# Patient Record
Sex: Male | Born: 1975 | Hispanic: No | Marital: Married | Smoking: Current every day smoker
Health system: Southern US, Community
[De-identification: ages and names within clinical notes are randomized; demographics above are authoritative.]

## PROBLEM LIST (undated history)

## (undated) DIAGNOSIS — E785 Hyperlipidemia, unspecified: Secondary | ICD-10-CM

## (undated) DIAGNOSIS — K4090 Unilateral inguinal hernia, without obstruction or gangrene, not specified as recurrent: Secondary | ICD-10-CM

## (undated) DIAGNOSIS — R519 Headache, unspecified: Secondary | ICD-10-CM

## (undated) DIAGNOSIS — R51 Headache: Secondary | ICD-10-CM

## (undated) DIAGNOSIS — T7840XA Allergy, unspecified, initial encounter: Secondary | ICD-10-CM

## (undated) HISTORY — DX: Allergy, unspecified, initial encounter: T78.40XA

## (undated) HISTORY — DX: Hyperlipidemia, unspecified: E78.5

## (undated) HISTORY — DX: Unilateral inguinal hernia, without obstruction or gangrene, not specified as recurrent: K40.90

---

## 2000-08-23 HISTORY — PX: ANKLE SURGERY: SHX546

## 2012-01-11 ENCOUNTER — Ambulatory Visit: Payer: Self-pay | Admitting: Internal Medicine

## 2012-01-18 ENCOUNTER — Ambulatory Visit: Payer: Self-pay | Admitting: Family Medicine

## 2012-01-25 ENCOUNTER — Ambulatory Visit: Payer: Self-pay | Admitting: Internal Medicine

## 2012-10-29 ENCOUNTER — Emergency Department: Payer: Self-pay | Admitting: Emergency Medicine

## 2013-07-11 ENCOUNTER — Emergency Department: Payer: Self-pay | Admitting: Emergency Medicine

## 2014-09-11 ENCOUNTER — Ambulatory Visit: Payer: Self-pay | Admitting: Emergency Medicine

## 2014-11-03 IMAGING — CR DG LUMBAR SPINE 2-3V
1 series · 3 of 3 positions shown · non-contrast
Comparison: None.

CLINICAL DATA: Low back pain

EXAM:
LUMBAR SPINE - 2-3 VIEW

[Series 1: t lumbar spine ap · 0.14mm/px · 3 of 3 slices shown]
[im 1/3]
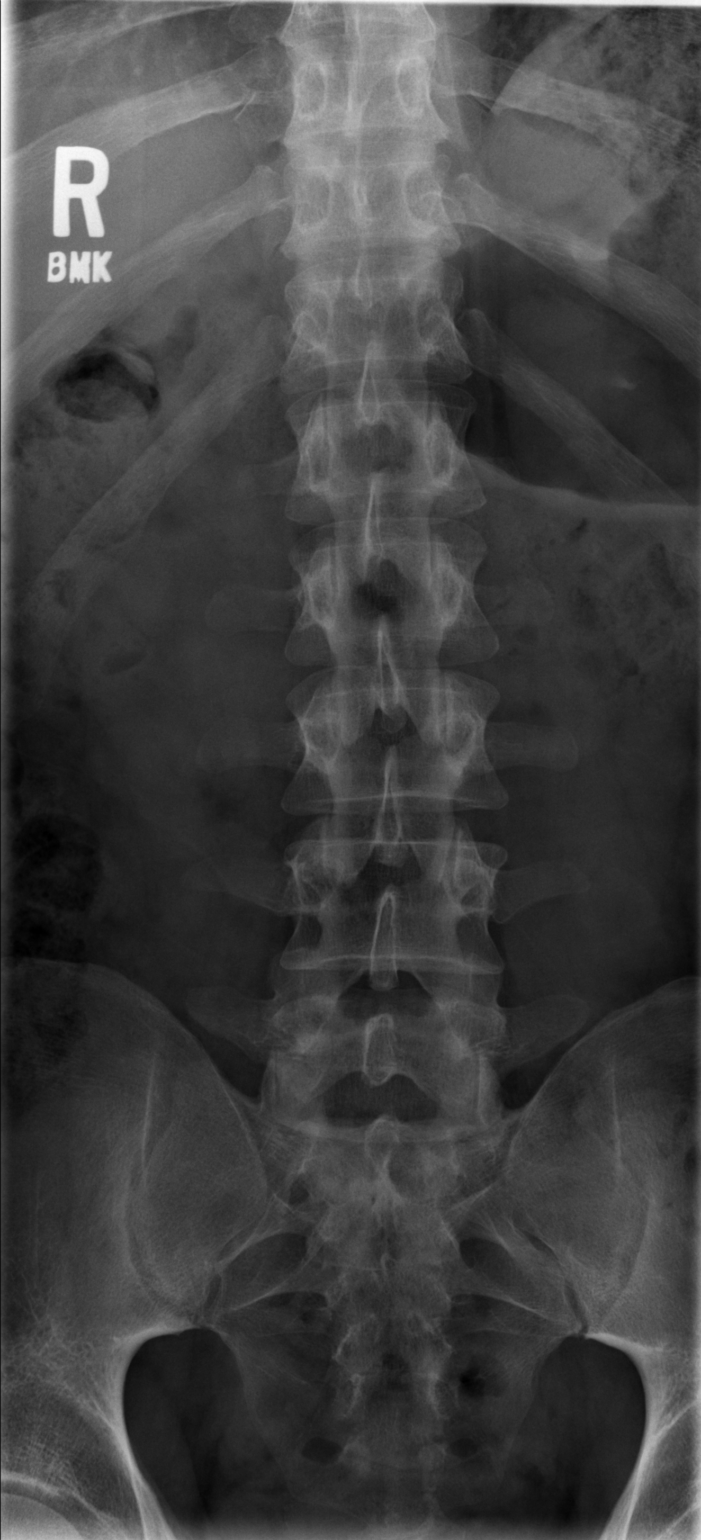
[im 2/3]
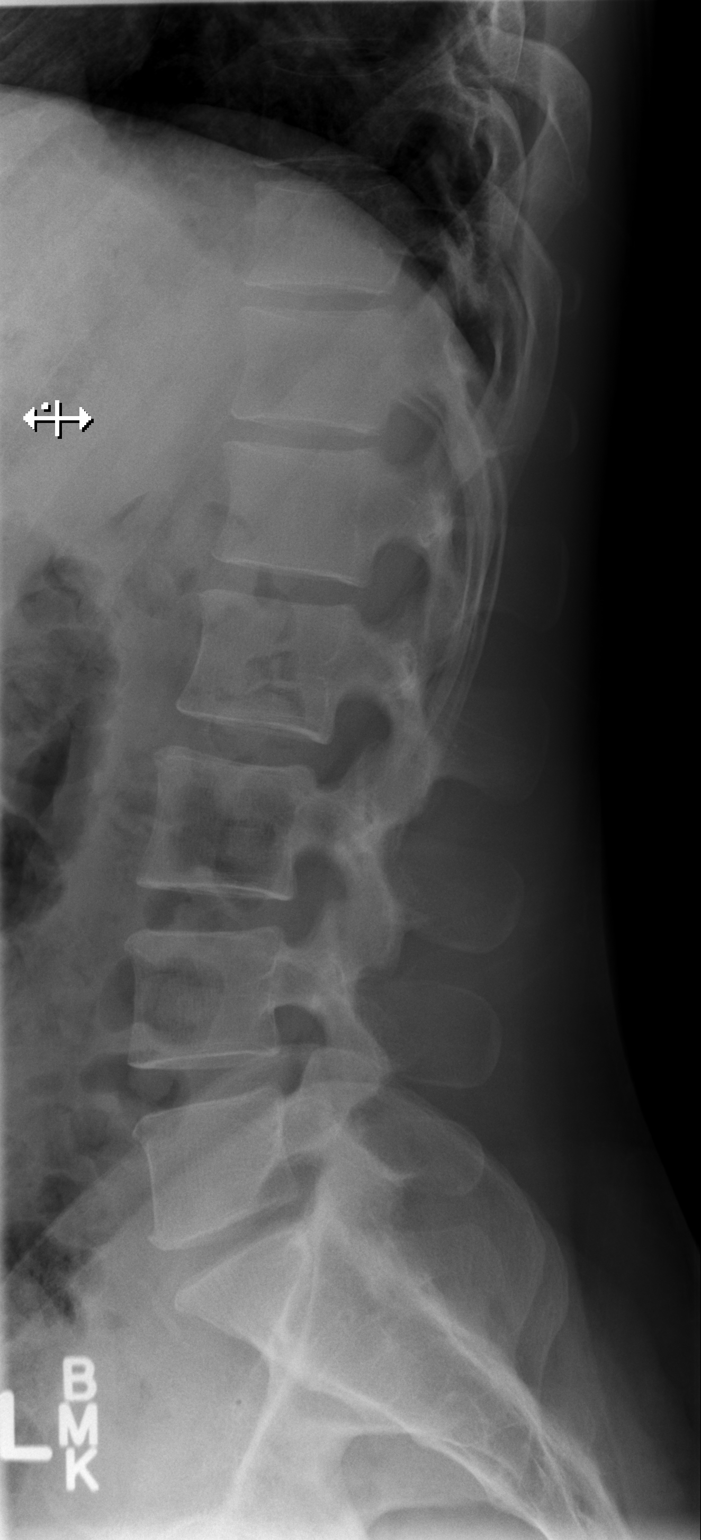
[im 3/3]
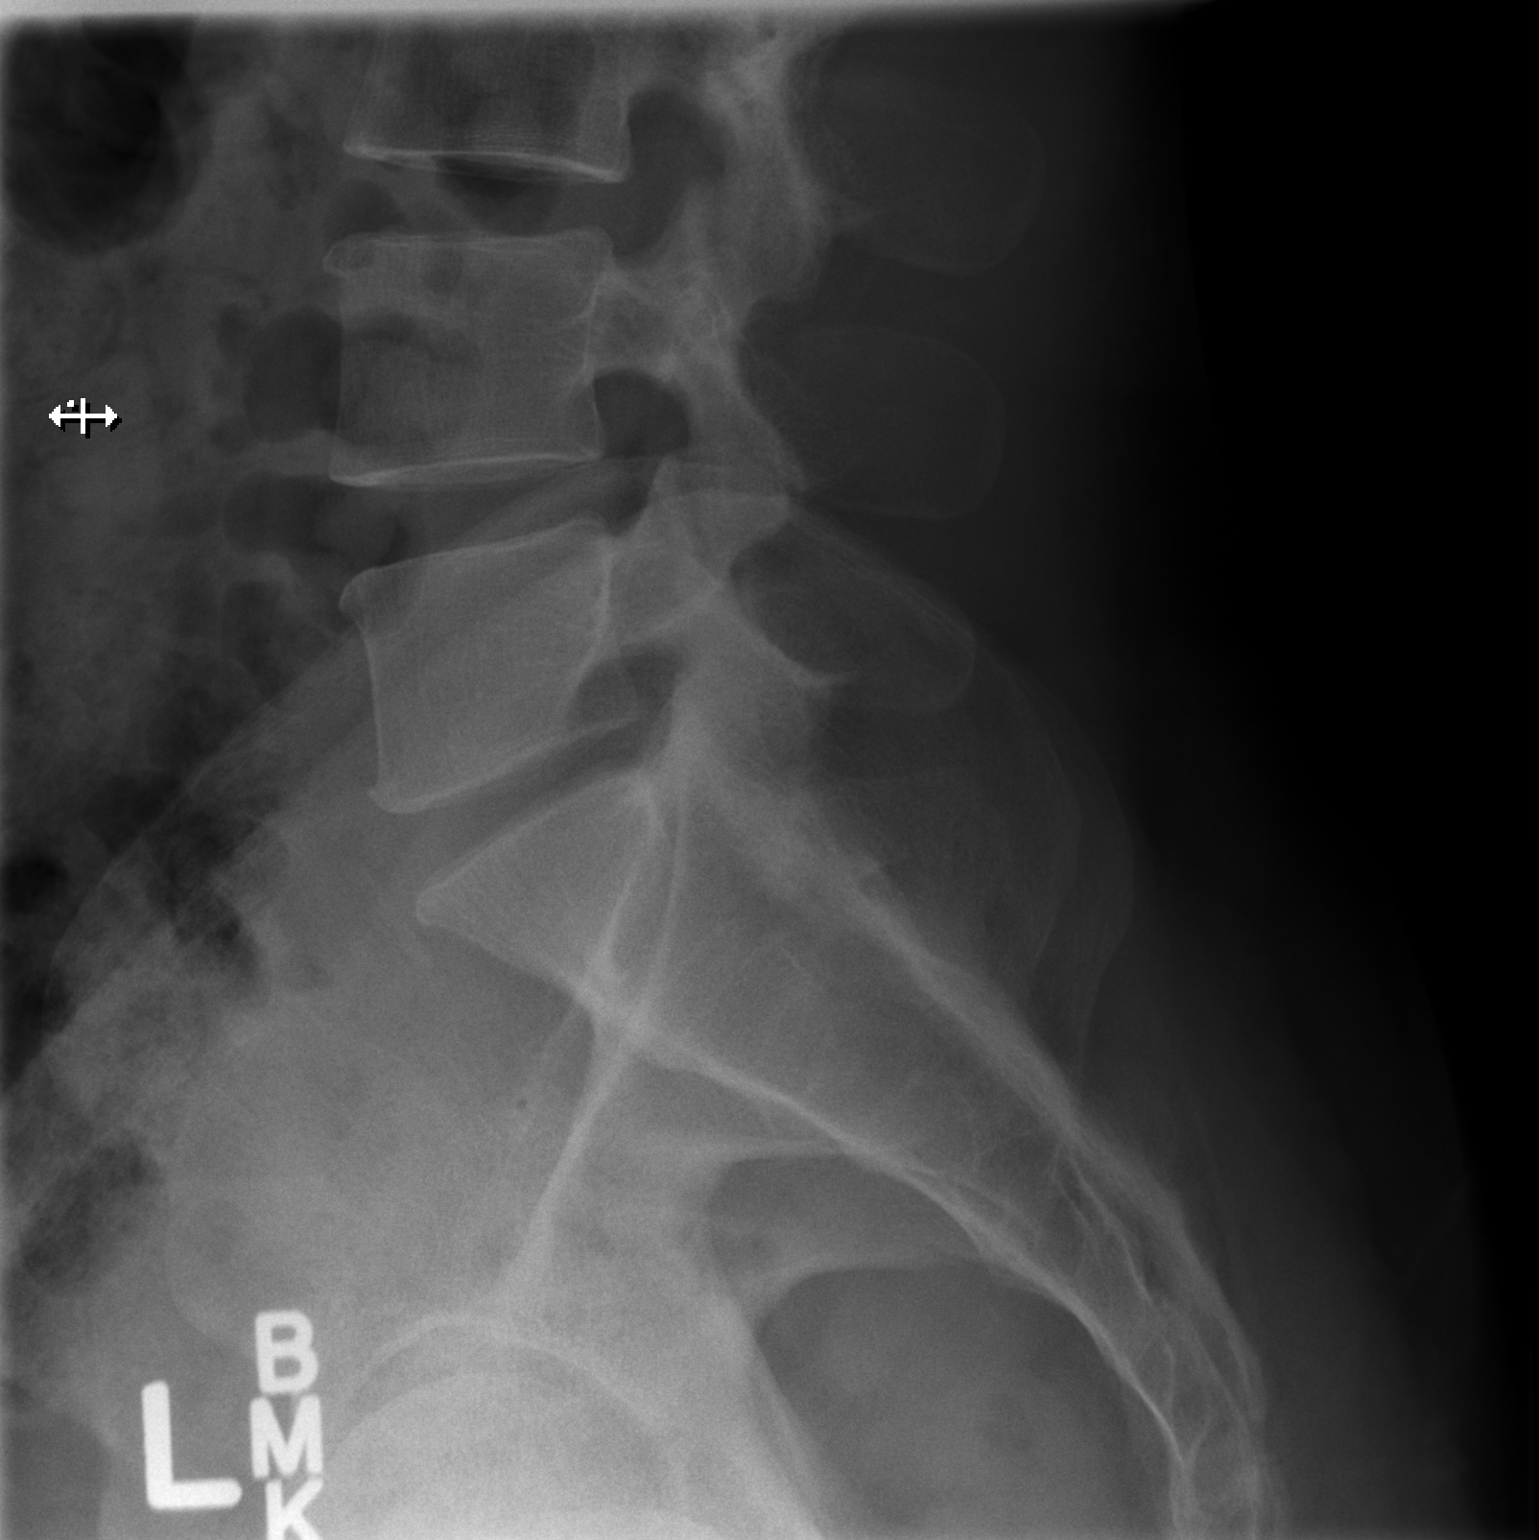

[3 of 3 positions shown; findings below may reference images not displayed]

FINDINGS: Frontal, lateral, and spot lumbosacral lateral images were obtained.
There are 5 non-rib-bearing lumbar type vertebral bodies. There is
minimal levoscoliosis. There is no fracture or spondylolisthesis.
Disk spaces appear intact. No erosive change.
IMPRESSION: No fracture or appreciable arthropathy.  Minimal scoliosis.

## 2015-09-26 DIAGNOSIS — K4090 Unilateral inguinal hernia, without obstruction or gangrene, not specified as recurrent: Secondary | ICD-10-CM

## 2015-09-26 HISTORY — DX: Unilateral inguinal hernia, without obstruction or gangrene, not specified as recurrent: K40.90

## 2015-10-20 ENCOUNTER — Encounter: Payer: Self-pay | Admitting: General Surgery

## 2015-10-20 ENCOUNTER — Ambulatory Visit (INDEPENDENT_AMBULATORY_CARE_PROVIDER_SITE_OTHER): Payer: Worker's Compensation | Admitting: General Surgery

## 2015-10-20 VITALS — BP 135/84 | HR 89 | Temp 98.9°F | Ht 67.0 in | Wt 171.0 lb

## 2015-10-20 DIAGNOSIS — K4090 Unilateral inguinal hernia, without obstruction or gangrene, not specified as recurrent: Secondary | ICD-10-CM

## 2015-10-20 DIAGNOSIS — G8929 Other chronic pain: Secondary | ICD-10-CM | POA: Insufficient documentation

## 2015-10-20 DIAGNOSIS — M549 Dorsalgia, unspecified: Secondary | ICD-10-CM

## 2015-10-20 NOTE — Progress Notes (Signed)
Patient ID: Devin Silva, male   DOB: 22-Oct-1975, 40 y.o.   MRN: [id number]  CC: Left groin bulge  HPI Devin Silva is a 40 y.o. male presents to clinic for evaluation of a new left groin bulge. Patient states he was lifting a palate work when he noticed a pulling sensation in his left groin. He then after this noticed a bulge in his groin that was easily reducible. This happened at work any [employer]. case was started. Since that time he is noted intermittent pains in the left groin primarily when he changes position from sitting to standing or if he is lifting something heavy. He denies any fevers, chills,  vomiting, diarrhea, constipation. Patient does state he has had intermittent nausea however he is unable state when this occurs other than after working for long period of time. He has not had any vomiting. He is otherwise in his usual state of excellent health not currently taking any medications. He's never had anything like this before. The area has always been soft and easily reducible.  HPI  Past Medical History  Diagnosis Date  . Inguinal hernia 09/26/15    Left Side  . Allergy     Seasonal    Past Surgical History  Procedure Laterality Date  . Ankle surgery Right 2002    Family History  Problem Relation Age of Onset  . Heart disease Mother     Social History Social History  Substance Use Topics  . Smoking status: Current Every Day Smoker -- 0.50 packs/day    Types: Cigarettes  . Smokeless tobacco: Never Used  . Alcohol Use: No    No Known Allergies  Current Outpatient Prescriptions  Medication Sig Dispense Refill  . Ascorbic Acid (VITAMIN C) 100 MG tablet Take 100 mg by mouth daily.    . Ginkgo Biloba 40 MG TABS Take 1 tablet by mouth daily.    . Maca Root 500 MG CAPS Take 1 capsule by mouth daily.    . vitamin A 7500 UNIT capsule Take 7,500 Units by mouth daily.     No current facility-administered medications for this visit.     Review of Systems A  multi-point review of systems was asked and was negative except for the findings documented in the history of present illness  Physical Exam Blood pressure 135/84, pulse 89, temperature 98.9 F (37.2 C), temperature source Oral, height 5\' 7"  (1.702 m), weight 77.565 kg (171 lb). CONSTITUTIONAL: No acute distress. EYES: Pupils are equal, round, and reactive to light, Sclera are non-icteric. EARS, NOSE, MOUTH AND THROAT: The oropharynx is clear. The oral mucosa is pink and moist. Hearing is intact to voice. LYMPH NODES:  Lymph nodes in the neck are normal. RESPIRATORY:  Lungs are clear. There is normal respiratory effort, with equal breath sounds bilaterally, and without pathologic use of accessory muscles. CARDIOVASCULAR: Heart is regular without murmurs, gallops, or rubs. GI: The abdomen is soft, nontender, and nondistended. There are no palpable masses. There is no hepatosplenomegaly. There are normal bowel sounds in all quadrants. GU: Normal-appearing male genitalia. There is an easily reducible and palpable left inguinal hernia that is tender on palpation. On the patient's right side there appears to be a laxity within the inguinal canal but there is no noticeable hernia on exam.   MUSCULOSKELETAL: Normal muscle strength and tone. No cyanosis or edema.   SKIN: Turgor is good and there are no pathologic skin lesions or ulcers. NEUROLOGIC: Motor and sensation is grossly  normal. Cranial nerves are grossly intact. PSYCH:  Oriented to person, place and time. Affect is normal.  Data Reviewed No images and labs reviewed I have personally reviewed the patient's imaging, laboratory findings and medical records.    Assessment    Left inguinal hernia    Plan    40 year old male with a new left inguinal hernia. The diagnosis was discussed in detail with the patient as well as the treatment options of an open versus laparoscopic hernia repair both procedures were discussed in detail to include the  risks, benefits, alternatives. Patient voiced understanding and wishes to proceed with a laparoscopic inguinal hernia repair. The specific risks discussed were of pain, bleeding, infection, recurrence, damage to the spermatic cord or the testicular vessels possibly requiring additional procedures for scrotal trauma. Patient voiced understanding and wishes to proceed on March 14. We will obtain preoperative labs, chest x-ray, EKG previously. Patient was also counseled that he needs to not smoke in the perioperative time. In order to promote healing. He also admits to using recreational marijuana and he was counseled to not do that.     Time spent with the patient was 55 minutes, with more than 50% of the time spent in face-to-face education, counseling and care coordination.     Clayburn Pert, MD FACS General Surgeon 10/20/2015, 11:57 AM

## 2015-10-20 NOTE — Patient Instructions (Signed)
You have chose to have your hernia repaired. We will arrange this to be done on 11/04/15 at [facility] with Dr. Adonis Huguenin.  Please see your (blue) Pre-care information that you have been given today.  You will need to arrange to be out of work for 2 weeks and then return with a lifting restrictions for 4 more weeks. Please send any FMLA paperwork prior to surgery and we will fill this out and fax it back to your employer within 3 business days.  You may have a bruise in your groin and also swelling and brusing in your testicle area. You may use ice 4-5 times daily for 15-20 minutes each time. Make sure that you place a barrier between you and the ice pack.

## 2015-10-21 ENCOUNTER — Telehealth: Payer: Self-pay | Admitting: General Surgery

## 2015-10-21 NOTE — Telephone Encounter (Signed)
I have called Pt to advise him of pre op date/time and sx date. No answer.I have left a message on voicemail.  Sx: 11/04/15 with Dr Gabrielle Dare left inguinal hernia repair. Pre op: 10/27/15 between 9-pm--Phone.   Patient made aware to call [phone], between 1-3:00 pm the day before surgery, to find out what time to arrive.

## 2015-10-22 NOTE — Telephone Encounter (Signed)
Patient has called back and was informed of all surgery information.

## 2015-10-27 ENCOUNTER — Other Ambulatory Visit: Payer: Self-pay

## 2015-10-27 ENCOUNTER — Encounter: Payer: Self-pay | Admitting: *Deleted

## 2015-10-27 NOTE — Patient Instructions (Signed)
  Your procedure is scheduled on: 11-04-15 (TUESDAY) Report to [facility] To find out your arrival time please call [phone] between 1PM - 3PM on 11-03-15 [facility])  Remember: Instructions that are not followed completely may result in serious medical risk, up to and including death, or upon the discretion of your surgeon and anesthesiologist your surgery may need to be rescheduled.    _X___ 1. Do not eat food or drink liquids after midnight. No gum chewing or hard candies.     _X___ 2. No Alcohol for 24 hours before or after surgery.   ____ 3. Bring all medications with you on the day of surgery if instructed.    _X___ 4. Notify your doctor if there is any change in your medical condition     (cold, fever, infections).     Do not wear jewelry, make-up, hairpins, clips or nail polish.  Do not wear lotions, powders, or perfumes. You may wear deodorant.  Do not shave 48 hours prior to surgery. Men may shave face and neck.  Do not bring valuables to the hospital.    [facility] is not responsible for any belongings or valuables.               Contacts, dentures or bridgework may not be worn into surgery.  Leave your suitcase in the car. After surgery it may be brought to your room.  For patients admitted to the hospital, discharge time is determined by your treatment team.   Patients discharged the day of surgery will not be allowed to drive home.   Please read over the following fact sheets that you were given:    ____ Take these medicines the morning of surgery with A SIP OF WATER:    1. NONE  2.   3.   4.  5.  6.  ____ Fleet Enema (as directed)   ____ Use CHG Soap as directed  ____ Use inhalers on the day of surgery  ____ Stop metformin 2 days prior to surgery    ____ Take 1/2 of usual insulin dose the night before surgery and none on the morning of surgery.   ____ Stop Coumadin/Plavix/aspirin-N/A  ____ Stop Anti-inflammatories-NO  NSAIDS OR ASPIRIN PRODUCTS-TYLENOL OK TO TAKE   _X___ Stop supplements until after surgery-SUPPLEMENTS STOPPED ON 10-20-15    ____ Bring C-Pap to the hospital.

## 2015-10-28 ENCOUNTER — Ambulatory Visit
Admission: RE | Admit: 2015-10-28 | Discharge: 2015-10-28 | Disposition: A | Discharge status: Home / Self Care | Payer: Managed Care, Other (non HMO) | Source: Ambulatory Visit | Attending: General Surgery | Admitting: General Surgery

## 2015-10-28 ENCOUNTER — Encounter
Admission: RE | Admit: 2015-10-28 | Discharge: 2015-10-28 | Disposition: A | Discharge status: Home / Self Care | Payer: Worker's Compensation | Source: Ambulatory Visit | Attending: General Surgery | Admitting: General Surgery

## 2015-10-28 DIAGNOSIS — F17200 Nicotine dependence, unspecified, uncomplicated: Secondary | ICD-10-CM | POA: Diagnosis not present

## 2015-10-28 DIAGNOSIS — R918 Other nonspecific abnormal finding of lung field: Secondary | ICD-10-CM | POA: Diagnosis not present

## 2015-10-28 DIAGNOSIS — Z01812 Encounter for preprocedural laboratory examination: Secondary | ICD-10-CM | POA: Insufficient documentation

## 2015-10-28 DIAGNOSIS — Z01810 Encounter for preprocedural cardiovascular examination: Secondary | ICD-10-CM | POA: Insufficient documentation

## 2015-10-28 LAB — BASIC METABOLIC PANEL
ANION GAP: 7 (ref 5–15)
BUN: 13 mg/dL (ref 6–20)
CALCIUM: 9.4 mg/dL (ref 8.9–10.3)
CO2: 28 mmol/L (ref 22–32)
Chloride: 105 mmol/L (ref 101–111)
Creatinine, Ser: 0.93 mg/dL (ref 0.61–1.24)
GFR calc Af Amer: >60 mL/min (ref >60)
GFR calc non Af Amer: >60 mL/min (ref >60)
GLUCOSE: 122 mg/dL — AB (ref 65–99)
Potassium: 4 mmol/L (ref 3.5–5.1)
Sodium: 140 mmol/L (ref 135–145)

## 2015-10-28 LAB — CBC WITH DIFFERENTIAL/PLATELET
BASOS PCT: 1 %
Basophils Absolute: 0.1 10*3/uL (ref 0–0.1)
EOS PCT: 5 %
Eosinophils Absolute: 0.4 10*3/uL (ref 0–0.7)
HEMATOCRIT: 45.3 % (ref 40.0–52.0)
Hemoglobin: 15.4 g/dL (ref 13.0–18.0)
Lymphocytes Relative: 29 %
Lymphs Abs: 2.7 10*3/uL (ref 1.0–3.6)
MCH: 30 pg (ref 26.0–34.0)
MCHC: 34 g/dL (ref 32.0–36.0)
MCV: 88.2 fL (ref 80.0–100.0)
MONO ABS: 0.6 10*3/uL (ref 0.2–1.0)
MONOS PCT: 7 %
NEUTROS ABS: 5.6 10*3/uL (ref 1.4–6.5)
Neutrophils Relative %: 58 %
Platelets: 255 10*3/uL (ref 150–440)
RBC: 5.14 MIL/uL (ref 4.40–5.90)
RDW: 13.3 % (ref 11.5–14.5)
WBC: 9.5 10*3/uL (ref 3.8–10.6)

## 2015-10-28 LAB — APTT: aPTT: 31 seconds (ref 24–36)

## 2015-10-28 LAB — PROTIME-INR
INR: 1.01
Prothrombin Time: 13.5 seconds (ref 11.4–15.0)

## 2015-10-28 NOTE — Pre-Procedure Instructions (Signed)
CALLED DR Amie Critchley ABOUT ABNORMAL EKG-MD WANTS MEDICAL CLEARANCE- EPIC STATES THAT PT HAS NO PCP-CALLED [facility] AT DR [facility] OFFICE AND INFORMED HER OF THIS AND THAT SHE WILL HAVE TO GET PT A PCP SO THAT CLEARANCE CAN BE OBTAINED-FAXED CLEARANCE ALONG WITH ABNORMAL EKG TO DR [facility] OFFICE AND RECEIVED FAX CONFIRMATION

## 2015-10-29 ENCOUNTER — Telehealth: Payer: Self-pay

## 2015-10-29 NOTE — Telephone Encounter (Signed)
Heather from [facility] called stating that patient's EKG was abnormal, therefore, his surgery could be cancelled. She asked for [place] to obtain medical/cardiac clearance from his primary care doctor.  Since patient doesn't have a primary care doctor, I will send him to see a cardiologist (Dr. Farrel Conners 10/30/2015 at 1:30 PM). I then called patient to let him know and he agreed on going to his appointment tomorrow since he wants to proceed with his surgery.

## 2015-10-30 ENCOUNTER — Ambulatory Visit (INDEPENDENT_AMBULATORY_CARE_PROVIDER_SITE_OTHER): Payer: Worker's Compensation | Admitting: Cardiology

## 2015-10-30 ENCOUNTER — Ambulatory Visit (INDEPENDENT_AMBULATORY_CARE_PROVIDER_SITE_OTHER): Payer: Worker's Compensation

## 2015-10-30 ENCOUNTER — Other Ambulatory Visit: Payer: Self-pay

## 2015-10-30 ENCOUNTER — Encounter: Payer: Self-pay | Admitting: Cardiology

## 2015-10-30 VITALS — BP 120/70 | HR 80 | Ht 67.5 in | Wt 166.5 lb

## 2015-10-30 DIAGNOSIS — Z01818 Encounter for other preprocedural examination: Secondary | ICD-10-CM | POA: Diagnosis not present

## 2015-10-30 DIAGNOSIS — R9431 Abnormal electrocardiogram [ECG] [EKG]: Secondary | ICD-10-CM | POA: Diagnosis not present

## 2015-10-30 LAB — ECHOCARDIOGRAM COMPLETE
EWDT: 173 ms
FS: 52 % — AB (ref 28–44)
HEIGHTINCHES: 67.5 in
IVS/LV PW RATIO, ED: 0.98
LEFT ATRIUM END SYS DIAM: 33 cm
LV PW d: 7.32 mm — AB (ref 0.6–1.1)
LVOT area: 3.14 cm2
LVOT peak vel: 109 m/s
MV pk A vel: 76.4 m/s
MV pk E vel: 128 m/s
MVPG: 7 mmHg
Stroke v: 66 ml
VTI: 21 cm
Weight: 2664 oz

## 2015-10-30 NOTE — Progress Notes (Signed)
Cardiology Office Note   Date:  10/30/2015   ID:  Devin Silva, DOB 05/28/1976, MRN [id number]  Referring Doctor:  No PCP Per Patient   Cardiologist:   Wende Bushy, MD   Reason for consultation:  Chief Complaint  Patient presents with  . other    Abn EKG needs cardiac clearance for inguinal hernia. No complaints today. Meds reviewed verbally with pt.      History of Present Illness: Devin Silva is a 40 y.o. male who presents for Preop cardiac evaluation for abnormal EKG, prior to planned inguinal hernia surgery.   Patient had an EKG preoperatively 10/28/2014 and it was read as possible anterior infarct. Patient was sent to cardiology for further evaluation.  Patient denies chest pain, shortness of breath, nausea, diaphoresis. Patient is physically active. he is able to walk up at least a flight of stairs without any difficulty. He does weight lifting up to 300 pounds without chest pain or shortness of breath.  No fever, cough, colds, abdominal pain, orthopnea, PND, edema.  ROS:  Please see the history of present illness. Aside from mentioned under HPI, all other systems are reviewed and negative.     Past Medical History  Diagnosis Date  . Inguinal hernia 09/26/15    Left Side  . Allergy     Seasonal  . Headache   . Hyperlipidemia     Past Surgical History  Procedure Laterality Date  . Ankle surgery Right 2002     reports that he has been smoking Cigarettes.  He has a 15 pack-year smoking history. He has never used smokeless tobacco. He reports that he drinks alcohol. He reports that he does not use illicit drugs.   family history includes Heart Problems in his mother; Heart disease in his mother. Heart problems and mother is unknown in detail  No current outpatient prescriptions on file.   No current facility-administered medications for this visit.    Allergies: Latex and Shellfish allergy    PHYSICAL EXAM: VS:  BP 120/70 mmHg  Pulse 80  Ht 5' 7.5"  (1.715 m)  Wt 166 lb 8 oz (75.524 kg)  BMI 25.68 kg/m2 , Body mass index is 25.68 kg/(m^2). Wt Readings from Last 3 Encounters:  10/30/15 166 lb 8 oz (75.524 kg)  10/20/15 171 lb (77.565 kg)    GENERAL:  well developed, well nourished, not in acute distress HEENT: normocephalic, pink conjunctivae, anicteric sclerae, no xanthelasma, normal dentition, oropharynx clear NECK:  no neck vein engorgement, JVP normal, no hepatojugular reflux, carotid upstroke brisk and symmetric, no bruit, no thyromegaly, no lymphadenopathy LUNGS:  good respiratory effort, clear to auscultation bilaterally CV:  PMI not displaced, no thrills, no lifts, S1 and S2 within normal limits, no palpable S3 or S4, no murmurs, no rubs, no gallops ABD:  Soft, nontender, nondistended, normoactive bowel sounds, no abdominal aortic bruit, no hepatomegaly, no splenomegaly MS: nontender back, no kyphosis, no scoliosis, no joint deformities EXT:  2+ DP/PT pulses, no edema, no varicosities, no cyanosis, no clubbing SKIN: warm, nondiaphoretic, normal turgor, no ulcers NEUROPSYCH: alert, oriented to person, place, and time, sensory/motor grossly intact, normal mood, appropriate affect  Recent Labs: 10/28/2015: BUN 13; Creatinine, Ser 0.93; Hemoglobin 15.4; Platelets 255; Potassium 4.0; Sodium 140   Lipid Panel No results found for: CHOL, TRIG, HDL, CHOLHDL, VLDL, LDLCALC, LDLDIRECT   Other studies Reviewed:  EKG:  EKG is ordered today. The ekg ordered today was personally reviewed by me and it reveals  10/30/2015 Sinus rhythm, 80 BPM. There is poor R-wave progression but cannot rule out possible anterior infarct. Abnormal EKG.  Additional studies/ records that were reviewed personally reviewed by me today include: None available  ASSESSMENT AND PLAN:  - Abnormal EKG There is poor R-wave progression, cannot rule out possible anterior infarct. Historically, no evidence of possible angina in the past. Patient has excellent  functional capacity. He is able to climb at least a flight of stairs without any difficulty. He continues to exercise without shortness of breath and chest pain. Recommend echocardiogram for clarification.  -Preoperative cardiac evaluation We'll await findings of echocardiogram. If this is unremarkable, patient is likely low cardiac risk for moderate risk inguinal hernia repair surgery.    Labs/ tests ordered today include:  Orders Placed This Encounter  Procedures  . EKG 12-Lead  . Echocardiogram       Disposition:   FU with undersigned after tests/when necessary if with abnormality on tests   Signed, Wende Bushy, MD  10/30/2015 2:20 PM    [place]

## 2015-10-30 NOTE — Patient Instructions (Addendum)
Medication Instructions:  Your physician recommends that you continue on your current medications as directed. Please refer to the Current Medication list given to you today.   Labwork: None ordered  Testing/Procedures: Your physician has requested that you have an echocardiogram. Echocardiography is a painless test that uses sound waves to create images of your heart. It provides your doctor with information about the size and shape of your heart and how well your heart's chambers and valves are working. This procedure takes approximately one hour. There are no restrictions for this procedure.  Date & Time:___________________________________________________  Follow-Up: Your physician recommends that you schedule a follow-up appointment as needed. We will call with your results.     Any Other Special Instructions Will Be Listed Below (If Applicable).     If you need a refill on your cardiac medications before your next appointment, please call your pharmacy.    Echocardiogram An echocardiogram, or echocardiography, uses sound waves (ultrasound) to produce an image of your heart. The echocardiogram is simple, painless, obtained within a short period of time, and offers valuable information to your health care provider. The images from an echocardiogram can provide information such as:  Evidence of coronary artery disease (CAD).  Heart size.  Heart muscle function.  Heart valve function.  Aneurysm detection.  Evidence of a past heart attack.  Fluid buildup around the heart.  Heart muscle thickening.  Assess heart valve function. LET [facility] CARE PROVIDER KNOW ABOUT:  Any allergies you have.  All medicines you are taking, including vitamins, herbs, eye drops, creams, and over-the-counter medicines.  Previous problems you or members of your family have had with the use of anesthetics.  Any blood disorders you have.  Previous surgeries you have had.  Medical  conditions you have.  Possibility of pregnancy, if this applies. BEFORE THE PROCEDURE  No special preparation is needed. Eat and drink normally.  PROCEDURE   In order to produce an image of your heart, gel will be applied to your chest and a wand-like tool (transducer) will be moved over your chest. The gel will help transmit the sound waves from the transducer. The sound waves will harmlessly bounce off your heart to allow the heart images to be captured in real-time motion. These images will then be recorded.  You may need an IV to receive a medicine that improves the quality of the pictures. AFTER THE PROCEDURE You may return to your normal schedule including diet, activities, and medicines, unless your health care provider tells you otherwise.   This information is not intended to replace advice given to you by your health care provider. Make sure you discuss any questions you have with your health care provider.   Document Released: 08/06/2000 Document Revised: 08/30/2014 Document Reviewed: 04/16/2013 Elsevier Interactive Patient Education [employer].

## 2015-10-31 ENCOUNTER — Telehealth: Payer: Self-pay | Admitting: Cardiology

## 2015-10-31 NOTE — Telephone Encounter (Signed)
Maritza from Dr. Reginal Lutes office called and needs form faxed over in addition to office visit note. Form faxed to [facility] and original placed in [facility] on [address].

## 2015-10-31 NOTE — Telephone Encounter (Signed)
Nurse with Dr. Adonis Huguenin, at [facility], calling regarding surgical clearance faxed yesterday. Please call and advise.

## 2015-11-04 ENCOUNTER — Encounter: Payer: Self-pay | Admitting: Anesthesiology

## 2015-11-04 ENCOUNTER — Ambulatory Visit
Admission: RE | Admit: 2015-11-04 | Discharge: 2015-11-04 | Disposition: A | Discharge status: Home / Self Care | Payer: Worker's Compensation | Source: Ambulatory Visit | Attending: General Surgery | Admitting: General Surgery

## 2015-11-04 ENCOUNTER — Ambulatory Visit: Payer: Worker's Compensation | Admitting: Anesthesiology

## 2015-11-04 ENCOUNTER — Encounter
Admission: RE | Disposition: A | Discharge status: Home / Self Care | Payer: Self-pay | Source: Ambulatory Visit | Attending: General Surgery

## 2015-11-04 DIAGNOSIS — F17210 Nicotine dependence, cigarettes, uncomplicated: Secondary | ICD-10-CM | POA: Diagnosis not present

## 2015-11-04 DIAGNOSIS — D176 Benign lipomatous neoplasm of spermatic cord: Secondary | ICD-10-CM | POA: Insufficient documentation

## 2015-11-04 DIAGNOSIS — Z8249 Family history of ischemic heart disease and other diseases of the circulatory system: Secondary | ICD-10-CM | POA: Diagnosis not present

## 2015-11-04 DIAGNOSIS — K4090 Unilateral inguinal hernia, without obstruction or gangrene, not specified as recurrent: Secondary | ICD-10-CM | POA: Diagnosis present

## 2015-11-04 DIAGNOSIS — R51 Headache: Secondary | ICD-10-CM | POA: Diagnosis not present

## 2015-11-04 DIAGNOSIS — Z79899 Other long term (current) drug therapy: Secondary | ICD-10-CM | POA: Diagnosis not present

## 2015-11-04 HISTORY — DX: Headache: R51

## 2015-11-04 HISTORY — DX: Headache, unspecified: R51.9

## 2015-11-04 HISTORY — PX: INGUINAL HERNIA REPAIR: SHX194

## 2015-11-04 LAB — URINE DRUG SCREEN, QUALITATIVE (ARMC ONLY)
AMPHETAMINES, UR SCREEN: NOT DETECTED
Barbiturates, Ur Screen: NOT DETECTED
Benzodiazepine, Ur Scrn: NOT DETECTED
CANNABINOID 50 NG, UR ~~LOC~~: NOT DETECTED
COCAINE METABOLITE, UR ~~LOC~~: NOT DETECTED
MDMA (ECSTASY) UR SCREEN: NOT DETECTED
Methadone Scn, Ur: NOT DETECTED
Opiate, Ur Screen: NOT DETECTED
PHENCYCLIDINE (PCP) UR S: NOT DETECTED
Tricyclic, Ur Screen: NOT DETECTED

## 2015-11-04 SURGERY — REPAIR, HERNIA, INGUINAL, LAPAROSCOPIC
Anesthesia: General | Laterality: Left | Wound class: Clean

## 2015-11-04 MED ORDER — ONDANSETRON HCL 4 MG/2ML IJ SOLN: 4.0000 mg | Freq: Once | INTRAMUSCULAR | Status: DC | PRN

## 2015-11-04 MED ORDER — LIDOCAINE HCL (PF) 1 % IJ SOLN
INTRAMUSCULAR | Status: DC | PRN
  Administered 2015-11-04: 15 mL

## 2015-11-04 MED ORDER — SUGAMMADEX SODIUM 200 MG/2ML IV SOLN
INTRAVENOUS | Status: DC | PRN
  Administered 2015-11-04: 151 mg via INTRAVENOUS

## 2015-11-04 MED ORDER — CHLORHEXIDINE GLUCONATE 4 % EX LIQD: 1.0000 "application " | Freq: Once | CUTANEOUS | Status: DC

## 2015-11-04 MED ORDER — BUPIVACAINE HCL (PF) 0.5 % IJ SOLN
INTRAMUSCULAR | Status: AC
  Filled 2015-11-04: qty 30

## 2015-11-04 MED ORDER — OXYCODONE-ACETAMINOPHEN 5-325 MG PO TABS
1.0000 | ORAL_TABLET | ORAL | Status: DC | PRN
  Administered 2015-11-04: 1 via ORAL

## 2015-11-04 MED ORDER — FENTANYL CITRATE (PF) 100 MCG/2ML IJ SOLN
INTRAMUSCULAR | Status: AC
  Administered 2015-11-04: 25 ug via INTRAVENOUS
  Filled 2015-11-04: qty 2

## 2015-11-04 MED ORDER — BUPIVACAINE HCL (PF) 0.5 % IJ SOLN
INTRAMUSCULAR | Status: DC | PRN
Start: 2015-11-04 — End: 2015-11-04
  Administered 2015-11-04: 15 mL

## 2015-11-04 MED ORDER — LACTATED RINGERS IV SOLN
INTRAVENOUS | Status: DC
  Administered 2015-11-04: 10:00:00 via INTRAVENOUS

## 2015-11-04 MED ORDER — LIDOCAINE HCL (PF) 1 % IJ SOLN
INTRAMUSCULAR | Status: AC
  Filled 2015-11-04: qty 2

## 2015-11-04 MED ORDER — KETOROLAC TROMETHAMINE 30 MG/ML IJ SOLN
INTRAMUSCULAR | Status: DC | PRN
  Administered 2015-11-04: 30 mg via INTRAVENOUS

## 2015-11-04 MED ORDER — FENTANYL CITRATE (PF) 100 MCG/2ML IJ SOLN
25.0000 ug | INTRAMUSCULAR | Status: DC | PRN
  Administered 2015-11-04 (×2): 25 ug via INTRAVENOUS

## 2015-11-04 MED ORDER — FENTANYL CITRATE (PF) 100 MCG/2ML IJ SOLN
INTRAMUSCULAR | Status: DC | PRN
  Administered 2015-11-04 (×2): 100 ug via INTRAVENOUS
  Administered 2015-11-04: 50 ug via INTRAVENOUS

## 2015-11-04 MED ORDER — DEXAMETHASONE SODIUM PHOSPHATE 10 MG/ML IJ SOLN
INTRAMUSCULAR | Status: DC | PRN
  Administered 2015-11-04: 10 mg via INTRAVENOUS

## 2015-11-04 MED ORDER — ONDANSETRON HCL 4 MG/2ML IJ SOLN
INTRAMUSCULAR | Status: DC | PRN
  Administered 2015-11-04: 4 mg via INTRAVENOUS

## 2015-11-04 MED ORDER — MIDAZOLAM HCL 2 MG/2ML IJ SOLN
INTRAMUSCULAR | Status: DC | PRN
  Administered 2015-11-04: 2 mg via INTRAVENOUS

## 2015-11-04 MED ORDER — FAMOTIDINE 20 MG PO TABS
ORAL_TABLET | ORAL | Status: AC
  Administered 2015-11-04: 20 mg via ORAL
  Filled 2015-11-04: qty 1

## 2015-11-04 MED ORDER — LIDOCAINE HCL (PF) 1 % IJ SOLN
INTRAMUSCULAR | Status: AC
  Filled 2015-11-04: qty 30

## 2015-11-04 MED ORDER — PROPOFOL 10 MG/ML IV BOLUS
INTRAVENOUS | Status: DC | PRN
  Administered 2015-11-04: 150 mg via INTRAVENOUS

## 2015-11-04 MED ORDER — CEFAZOLIN SODIUM-DEXTROSE 2-3 GM-% IV SOLR
INTRAVENOUS | Status: AC
  Filled 2015-11-04: qty 50

## 2015-11-04 MED ORDER — OXYCODONE-ACETAMINOPHEN 5-325 MG PO TABS
ORAL_TABLET | ORAL | Status: AC
  Filled 2015-11-04: qty 1

## 2015-11-04 MED ORDER — OXYCODONE-ACETAMINOPHEN 5-325 MG PO TABS: 1.0000 | ORAL_TABLET | ORAL | Status: DC | PRN

## 2015-11-04 MED ORDER — FAMOTIDINE 20 MG PO TABS
20.0000 mg | ORAL_TABLET | Freq: Once | ORAL | Status: AC
  Administered 2015-11-04: 20 mg via ORAL

## 2015-11-04 MED ORDER — CEFAZOLIN SODIUM-DEXTROSE 2-3 GM-% IV SOLR
2.0000 g | INTRAVENOUS | Status: AC
  Administered 2015-11-04: 2 g via INTRAVENOUS

## 2015-11-04 MED ORDER — ROCURONIUM BROMIDE 100 MG/10ML IV SOLN
INTRAVENOUS | Status: DC | PRN
Start: 2015-11-04 — End: 2015-11-04
  Administered 2015-11-04: 30 mg via INTRAVENOUS
  Administered 2015-11-04: 20 mg via INTRAVENOUS

## 2015-11-04 SURGICAL SUPPLY — 40 items
BLADE SURG SZ11 CARB STEEL (BLADE) ×3 IMPLANT
CANNULA DILATOR 12 W/SLV (CANNULA) IMPLANT
CANNULA DILATOR 12MM W/SLV (CANNULA)
CATH TRAY 16F METER LATEX (MISCELLANEOUS) ×3 IMPLANT
CHLORAPREP W/TINT 26ML (MISCELLANEOUS) ×3 IMPLANT
DEVICE SECURE STRAP 25 ABSORB (INSTRUMENTS) ×3 IMPLANT
DISSECT BALLN SPACEMKR OVL PDB (BALLOONS) ×3
DISSECT BALLN SPACEMKR RND PDB (MISCELLANEOUS)
DISSECTOR BALLN SPCMKR OVL PDB (BALLOONS) ×1 IMPLANT
DISSECTOR BALLN SPCMKR RND PDB (MISCELLANEOUS) IMPLANT
DISSECTOR KITTNER STICK (MISCELLANEOUS) IMPLANT
DISSECTORS/KITTNER STICK (MISCELLANEOUS)
GAUZE SPONGE NON-WVN 2X2 STRL (MISCELLANEOUS) IMPLANT
GLOVE BIO SURGEON STRL SZ7.5 (GLOVE) ×9 IMPLANT
GLOVE BIOGEL PI IND STRL 8 (GLOVE) ×1 IMPLANT
GLOVE BIOGEL PI INDICATOR 8 (GLOVE) ×2
GOWN STRL REUS W/ TWL LRG LVL3 (GOWN DISPOSABLE) ×2 IMPLANT
GOWN STRL REUS W/TWL LRG LVL3 (GOWN DISPOSABLE) ×4
IRRIGATION STRYKERFLOW (MISCELLANEOUS) IMPLANT
IRRIGATOR STRYKERFLOW (MISCELLANEOUS)
LABEL OR SOLS (LABEL) ×3 IMPLANT
LIQUID BAND (GAUZE/BANDAGES/DRESSINGS) ×3 IMPLANT
MESH PARIETEX 6X4IN LEFT (Mesh General) ×3 IMPLANT
NDL INSUFF ACCESS 14 VERSASTEP (NEEDLE) ×3 IMPLANT
NDL SAFETY 22GX1.5 (NEEDLE) ×3 IMPLANT
NS IRRIG 500ML POUR BTL (IV SOLUTION) ×3 IMPLANT
PACK LAP CHOLECYSTECTOMY (MISCELLANEOUS) ×3 IMPLANT
PENCIL ELECTRO HAND CTR (MISCELLANEOUS) ×3 IMPLANT
SCISSORS METZENBAUM CVD 33 (INSTRUMENTS) ×3 IMPLANT
SPONGE LAP 18X18 5 PK (GAUZE/BANDAGES/DRESSINGS) IMPLANT
SPONGE VERSALON 2X2 STRL (MISCELLANEOUS)
SURGILUBE 2OZ TUBE FLIPTOP (MISCELLANEOUS) ×3 IMPLANT
SUT ETHIBOND 0 (SUTURE) IMPLANT
SUT MNCRL 4-0 (SUTURE) ×2
SUT MNCRL 4-0 27XMFL (SUTURE) ×1
SUT VICRYL 0 AB UR-6 (SUTURE) IMPLANT
SUTURE MNCRL 4-0 27XMF (SUTURE) ×1 IMPLANT
TROCAR 5MM SINGLE VERSAONE (TROCAR) ×6 IMPLANT
TROCAR BALLN 10M OMST10SB SPAC (TROCAR) ×3 IMPLANT
TUBING INSUFFLATOR HI FLOW (MISCELLANEOUS) ×3 IMPLANT

## 2015-11-04 NOTE — Brief Op Note (Signed)
11/04/2015  11:36 AM  PATIENT:  Devin Silva  40 y.o. male  PRE-OPERATIVE DIAGNOSIS:  LEFT INGUINAL HERNIA  POST-OPERATIVE DIAGNOSIS:  LEFT INGUINAL HERNIA  PROCEDURE:  Procedure(s): LAPAROSCOPIC INGUINAL HERNIA (Left)  SURGEON:  Surgeon(s) and Role:    * Clayburn Pert, MD - Primary  PHYSICIAN ASSISTANT:   ASSISTANTS: none   ANESTHESIA:   general  EBL:  Total I/O In: 100 [I.V.:100] Out: 110 [Urine:100; Blood:10]  BLOOD ADMINISTERED:none  DRAINS: none   LOCAL MEDICATIONS USED:  MARCAINE    and XYLOCAINE   SPECIMEN:  No Specimen  DISPOSITION OF SPECIMEN:  N/A  COUNTS:  YES  TOURNIQUET:  * No tourniquets in log *  DICTATION: .Dragon Dictation  PLAN OF CARE: Discharge to home after PACU  PATIENT DISPOSITION:  PACU - hemodynamically stable.   Delay start of Pharmacological VTE agent (>24hrs) due to surgical blood loss or risk of bleeding: not applicable

## 2015-11-04 NOTE — Op Note (Signed)
Laparoscopic Left Inguinal Hernia Repair  Devin Silva  11/04/2015  Pre-operative Diagnosis: Left Inguinal Hernia  Post-operative Diagnosis: Left Inguinal hernia  Procedure: Laparoscopic preperitoneal repair of bilateral inguinal hernias   Surgeon: Juanda Crumble T. Adonis Huguenin, MD FACS  Anesthesia: Gen. with endotracheal tube  Assistant:none  Procedure Details  The patient was seen again in the Holding Room. The benefits, complications, treatment options, and expected outcomes were discussed with the patient. The risks of bleeding, infection, recurrence of symptoms, failure to resolve symptoms, recurrence of hernia, ischemic orchitis, chronic pain syndrome or neuroma, were discussed again. The likelihood of improving the patient's symptoms with return to their baseline status is good.  The patient and/or family concurred with the proposed plan, giving informed consent.  The patient was taken to Operating Room, identified as Devin Silva and the procedure verified as Laparoscopic Inguinal Hernia Repair. Laterality confirmed.  A Time Out was held and the above information confirmed.  Prior to the induction of general anesthesia, antibiotic prophylaxis was administered. VTE prophylaxis was in place. General endotracheal anesthesia was then administered and tolerated well. A Foley catheter was placed by the nursing staff. After the induction, the abdomen was prepped with Chloraprep and draped in the sterile fashion. The patient was positioned in the supine position.  Local anesthetic  was injected into the skin near the umbilicus and an incision made. An incision was made and dissection down to the rectus fascia was performed. The fascia was incised and the muscle retracted laterally to the left. The Covidien dissecting balloon was placed followed by the structural balloon. The preperitoneal space was insufflated and under direct vision 2 midline 5 mm ports were placed.  Dissection was performed to  delineate Cooper's ligament and the lateral extent of dissection was determined on each side. The nerve on the lateral abdominal wall was identified and kept in view at all times. The cord was skeletonized of the indirect sac and cord lipoma which was retracted cephalad on the left side. The right side was visualized and no evidence of hernia or lipoma was appreciated  Once this was complete, a Covidien anatomic left sided mesh was opened on the back table and cut to appropriate size and foldable intraocular form that was held in place with a 0 Monocryl suture and was placed into the preperitoneal space on the left side. With the mesh on the left side the suture was cut when it was parallel with the spermatic cord. The mesh was unfurled and then visualized covering the indirect, direct, femoral spaces with ease. were held in place with the secure strap tacking device avoiding the area of the nerve. Once assuring that the hernias were completely repaired and adequately covered, the preperitoneal space was desufflated under direct vision. There was no sign of peritoneal rent and no sign of bowel intrusion towards the mesh.  Once assuring that hemostasis was adequate the ports were removed and a figure-of-eight 0 Vicryl suture was placed at the fascial edges of the umbilical site. 4-0 subcuticular Monocryl was used at all skin edges. Local band was then placed to seal each incision.  Patient tolerated the procedure well. There were no complications. He was taken to the recovery room in stable condition to be discharged to the care of his family and follow-up in 10 days.    Findings: Left-sided indirect inguinal hernia                        Juanda Crumble  Devin Palms, MD, FACS

## 2015-11-04 NOTE — Transfer of Care (Signed)
Immediate Anesthesia Transfer of Care Note  Patient: Devin Silva  Procedure(s) Performed: Procedure(s): LAPAROSCOPIC INGUINAL HERNIA (Left)  Patient Location: PACU  Anesthesia Type:General  Level of Consciousness: awake  Airway & Oxygen Therapy: Patient Spontanous Breathing and Patient connected to face mask oxygen  Post-op Assessment: Report given to RN and Post -op Vital signs reviewed and stable  Post vital signs: stable  Last Vitals:  Filed Vitals:   11/04/15 0953 11/04/15 1144  BP: 110/74 111/66  Pulse: 68 56  Temp: 36.6 C 36.3 C  Resp:  11    Complications: No apparent anesthesia complications

## 2015-11-04 NOTE — OR Nursing (Signed)
Patient has a reaction to latex that is itching and no anaphylaxis.

## 2015-11-04 NOTE — Discharge Instructions (Signed)
Laparoscopic inguinal Hernia Repair, Care After Refer to this sheet in the next few weeks. These instructions provide you with information on caring for yourself after your procedure. Your caregiver may also give you more specific instructions. Your treatment has been planned according to current medical practices, but problems sometimes occur. Call your caregiver if you have any problems or questions after your procedure.  HOME CARE INSTRUCTIONS   Only take over-the-counter or prescription medicines as directed by your caregiver. If antibiotic medicines are prescribed, take them as directed. Finish them even if you start to feel better.  Always wash your hands before touching your abdomen.  Take your bandages (dressings) off after 48 hours or as directed by your caregiver. You may have skin adhesive strips or skin glue over the surgical cuts (incisions). Do not take the strips off or peel the glue off. These will fall off on their own.  Take showers once your caregiver approves. Until then, only take sponge baths. Do not take tub baths or go swimming until your caregiver approves.  Check your incision area every day for swelling, redness, warmth, and blood or fluid leaking from the incision. These are signs of infection. Wash your hands before you check.  Hold a pillow over your abdomen when you cough or sneeze to help ease pain.  Eat foods that are high in fiber, such as whole grains, fruits, and vegetables. Fiber helps prevent difficult bowel movements (constipation).  Drink enough fluids to keep your urine clear or pale yellow.  Rest and lessen activity for 4-5 days after the surgery. You may take short walks if your caregiver approves. Do not drive until approved by your caregiver.  Do not lift anything heavy, participate in sports, or have sexual intercourse for 6-8 weeks or until your caregiver approves.   Ask your caregiver when you can return to work.  It is normal for bruising to  appear in the scrotum 3-5 days after surgery  Keep all follow-up appointments. SEEK MEDICAL CARE IF:   You have pain even after taking pain medicine.  You have not had a bowel movement in 3 days.  You have cramps or are nauseous. SEEK IMMEDIATE MEDICAL CARE IF:   You have pain or swelling that is getting worse.  You have redness around your incisions.  Your incision is pulling apart.  You have blood or fluid leaking from your incisions.  You are vomiting.  You cannot pass urine. MAKE SURE YOU:   Understand these instructions.  Will watch your condition.  Will get help right away if you are not doing well or get worse.   This information is not intended to replace advice given to you by your health care provider. Make sure you discuss any questions you have with your health care provider.   Document Released: 07/26/2012 Document Revised: 08/30/2014 Document Reviewed: 07/26/2012 Elsevier Interactive Patient Education 2016 [place]   1) The drugs that you were given will stay in your system until tomorrow so for the next 24 hours you should not:  A) Drive an automobile B) Make any legal decisions C) Drink any alcoholic beverage   2) You may resume regular meals tomorrow.  Today it is better to start with liquids and gradually work up to solid foods.  You may eat anything you prefer, but it is better to start with liquids, then soup and crackers, and gradually work up to solid foods.   3) Please notify  your doctor immediately if you have any unusual bleeding, trouble breathing, redness and pain at the surgery site, drainage, fever, or pain not relieved by medication.    4) Additional Instructions:        Please contact your physician with any problems or Same Day Surgery at [phone], Monday through Friday 6 am to 4 pm, or [place] at [facility] number at [phone].

## 2015-11-04 NOTE — H&P (View-Only) (Signed)
Patient ID: Devin Silva, male   DOB: 1975/11/27, 40 y.o.   MRN: [id number]  CC: Left groin bulge  HPI Devin Silva is a 40 y.o. male presents to clinic for evaluation of a new left groin bulge. Patient states he was lifting a palate work when he noticed a pulling sensation in his left groin. He then after this noticed a bulge in his groin that was easily reducible. This happened at work any [employer]. case was started. Since that time he is noted intermittent pains in the left groin primarily when he changes position from sitting to standing or if he is lifting something heavy. He denies any fevers, chills,  vomiting, diarrhea, constipation. Patient does state he has had intermittent nausea however he is unable state when this occurs other than after working for long period of time. He has not had any vomiting. He is otherwise in his usual state of excellent health not currently taking any medications. He's never had anything like this before. The area has always been soft and easily reducible.  HPI  Past Medical History  Diagnosis Date  . Inguinal hernia 09/26/15    Left Side  . Allergy     Seasonal    Past Surgical History  Procedure Laterality Date  . Ankle surgery Right 2002    Family History  Problem Relation Age of Onset  . Heart disease Mother     Social History Social History  Substance Use Topics  . Smoking status: Current Every Day Smoker -- 0.50 packs/day    Types: Cigarettes  . Smokeless tobacco: Never Used  . Alcohol Use: No    No Known Allergies  Current Outpatient Prescriptions  Medication Sig Dispense Refill  . Ascorbic Acid (VITAMIN C) 100 MG tablet Take 100 mg by mouth daily.    . Ginkgo Biloba 40 MG TABS Take 1 tablet by mouth daily.    . Maca Root 500 MG CAPS Take 1 capsule by mouth daily.    . vitamin A 7500 UNIT capsule Take 7,500 Units by mouth daily.     No current facility-administered medications for this visit.     Review of Systems A  multi-point review of systems was asked and was negative except for the findings documented in the history of present illness  Physical Exam Blood pressure 135/84, pulse 89, temperature 98.9 F (37.2 C), temperature source Oral, height 5\' 7"  (1.702 m), weight 77.565 kg (171 lb). CONSTITUTIONAL: No acute distress. EYES: Pupils are equal, round, and reactive to light, Sclera are non-icteric. EARS, NOSE, MOUTH AND THROAT: The oropharynx is clear. The oral mucosa is pink and moist. Hearing is intact to voice. LYMPH NODES:  Lymph nodes in the neck are normal. RESPIRATORY:  Lungs are clear. There is normal respiratory effort, with equal breath sounds bilaterally, and without pathologic use of accessory muscles. CARDIOVASCULAR: Heart is regular without murmurs, gallops, or rubs. GI: The abdomen is soft, nontender, and nondistended. There are no palpable masses. There is no hepatosplenomegaly. There are normal bowel sounds in all quadrants. GU: Normal-appearing male genitalia. There is an easily reducible and palpable left inguinal hernia that is tender on palpation. On the patient's right side there appears to be a laxity within the inguinal canal but there is no noticeable hernia on exam.   MUSCULOSKELETAL: Normal muscle strength and tone. No cyanosis or edema.   SKIN: Turgor is good and there are no pathologic skin lesions or ulcers. NEUROLOGIC: Motor and sensation is grossly  normal. Cranial nerves are grossly intact. PSYCH:  Oriented to person, place and time. Affect is normal.  Data Reviewed No images and labs reviewed I have personally reviewed the patient's imaging, laboratory findings and medical records.    Assessment    Left inguinal hernia    Plan    40 year old male with a new left inguinal hernia. The diagnosis was discussed in detail with the patient as well as the treatment options of an open versus laparoscopic hernia repair both procedures were discussed in detail to include the  risks, benefits, alternatives. Patient voiced understanding and wishes to proceed with a laparoscopic inguinal hernia repair. The specific risks discussed were of pain, bleeding, infection, recurrence, damage to the spermatic cord or the testicular vessels possibly requiring additional procedures for scrotal trauma. Patient voiced understanding and wishes to proceed on March 14. We will obtain preoperative labs, chest x-ray, EKG previously. Patient was also counseled that he needs to not smoke in the perioperative time. In order to promote healing. He also admits to using recreational marijuana and he was counseled to not do that.     Time spent with the patient was 55 minutes, with more than 50% of the time spent in face-to-face education, counseling and care coordination.     Clayburn Pert, MD FACS General Surgeon 10/20/2015, 11:57 AM

## 2015-11-04 NOTE — Anesthesia Postprocedure Evaluation (Signed)
Anesthesia Post Note  Patient: Devin Silva  Procedure(s) Performed: Procedure(s) (LRB): LAPAROSCOPIC INGUINAL HERNIA (Left)  Patient location during evaluation: PACU Anesthesia Type: General Level of consciousness: awake and alert Pain management: pain level controlled Vital Signs Assessment: post-procedure vital signs reviewed and stable Respiratory status: spontaneous breathing, nonlabored ventilation, respiratory function stable and patient connected to nasal cannula oxygen Cardiovascular status: blood pressure returned to baseline and stable Postop Assessment: no signs of nausea or vomiting Anesthetic complications: no    Last Vitals:  Filed Vitals:   11/04/15 1305 11/04/15 1429  BP: 113/71 106/71  Pulse: 71 67  Temp: 36.8 C   Resp: 16 16    Last Pain:  Filed Vitals:   11/04/15 1433  PainSc: [name]

## 2015-11-04 NOTE — Interval H&P Note (Signed)
History and Physical Interval Note:  11/04/2015 9:38 AM  Devin Silva  has presented today for surgery, with the diagnosis of LEFT INGUINAL HERNIA  The various methods of treatment have been discussed with the patient and family. After consideration of risks, benefits and other options for treatment, the patient has consented to  Procedure(s): LAPAROSCOPIC INGUINAL HERNIA (Left) as a surgical intervention .  The patient's history has been reviewed, patient examined, no change in status, stable for surgery.  I have reviewed the patient's chart and labs.  Questions were answered to the patient's satisfaction.     Clayburn Pert

## 2015-11-04 NOTE — Anesthesia Preprocedure Evaluation (Addendum)
Anesthesia Evaluation  Patient identified by MRN, date of birth, ID band Patient awake    Reviewed: Allergy & Precautions, NPO status , Patient's Chart, lab work & pertinent test results, reviewed documented beta blocker date and time   Airway Mallampati: II  TM Distance: >3 FB     Dental  (+) Chipped   Pulmonary Current Smoker,           Cardiovascular      Neuro/Psych  Headaches,    GI/Hepatic   Endo/Other    Renal/GU      Musculoskeletal   Abdominal   Peds  Hematology   Anesthesia Other Findings Ekg  Poor R wave progression otherwise ok. Echo ok EF 60.  Reproductive/Obstetrics                            Anesthesia Physical Anesthesia Plan  ASA: II  Anesthesia Plan: General   Post-op Pain Management:    Induction: Intravenous  Airway Management Planned: Oral ETT  Additional Equipment:   Intra-op Plan:   Post-operative Plan:   Informed Consent: I have reviewed the patients History and Physical, chart, labs and discussed the procedure including the risks, benefits and alternatives for the proposed anesthesia with the patient or authorized representative who has indicated his/her understanding and acceptance.     Plan Discussed with: CRNA  Anesthesia Plan Comments:         Anesthesia Quick Evaluation

## 2015-11-04 NOTE — Anesthesia Procedure Notes (Signed)
Procedure Name: Intubation Date/Time: 11/04/2015 10:18 AM Performed by: Jonna Clark Pre-anesthesia Checklist: Patient identified, Patient being monitored, Timeout performed, Emergency Drugs available and Suction available Patient Re-evaluated:Patient Re-evaluated prior to inductionOxygen Delivery Method: Circle system utilized Preoxygenation: Pre-oxygenation with 100% oxygen Intubation Type: IV induction Ventilation: Mask ventilation without difficulty Laryngoscope Size: Mac and 3 Grade View: Grade I Tube type: Oral Tube size: 7.5 mm Number of attempts: 1 Placement Confirmation: ETT inserted through vocal cords under direct vision,  positive ETCO2 and breath sounds checked- equal and bilateral Secured at: 21 cm Tube secured with: Tape Dental Injury: Teeth and Oropharynx as per pre-operative assessment

## 2015-11-08 ENCOUNTER — Telehealth: Payer: Self-pay

## 2015-11-08 NOTE — Telephone Encounter (Signed)
[employer] FMLA Forms were filled out and faxed.

## 2015-11-13 ENCOUNTER — Encounter: Payer: Self-pay | Admitting: General Surgery

## 2015-11-13 ENCOUNTER — Ambulatory Visit (INDEPENDENT_AMBULATORY_CARE_PROVIDER_SITE_OTHER): Payer: Worker's Compensation | Admitting: General Surgery

## 2015-11-13 VITALS — BP 139/85 | HR 76 | Temp 98.3°F | Wt 169.0 lb

## 2015-11-13 DIAGNOSIS — Z4889 Encounter for other specified surgical aftercare: Secondary | ICD-10-CM

## 2015-11-13 MED ORDER — CELECOXIB 200 MG PO CAPS: 200.0000 mg | ORAL_CAPSULE | Freq: Every day | ORAL | Status: AC

## 2015-11-13 MED ORDER — TRAMADOL HCL 50 MG PO TABS: 50.0000 mg | ORAL_TABLET | Freq: Four times a day (QID) | ORAL | Status: AC | PRN

## 2015-11-13 NOTE — Progress Notes (Signed)
Outpatient Surgical Follow Up  11/13/2015  Devin Silva is an 40 y.o. male.   Chief Complaint  Patient presents with  . Routine Post Op    Laparoscopic Inguinal Hernia Repair Dr. Adonis Huguenin 11/04/2015    HPI: 40 year old male returns to clinic in 9 days status post laparoscopic left inguinal hernia repair. Patient states he's had intermittent, extreme groin pains since surgery. He has not been taking any pain medications because he didn't like, Percocet made him feel. He denies any fevers, chills, nausea, vomiting, diarrhea, aspiration. His complaints are of pain to the groin and into his scrotum.  Past Medical History  Diagnosis Date  . Inguinal hernia 09/26/15    Left Side  . Allergy     Seasonal  . Headache   . Hyperlipidemia     Past Surgical History  Procedure Laterality Date  . Ankle surgery Right 2002  . Inguinal hernia repair Left 11/04/2015    Procedure: LAPAROSCOPIC INGUINAL HERNIA;  Surgeon: Clayburn Pert, MD;  Location: [facility] ORS;  Service: General;  Laterality: Left;    Family History  Problem Relation Age of Onset  . Heart disease Mother   . Heart Problems Mother     Social History:  reports that he has been smoking Cigarettes.  He has a 15 pack-year smoking history. He has never used smokeless tobacco. He reports that he drinks alcohol. He reports that he does not use illicit drugs.  Allergies:  Allergies  Allergen Reactions  . Latex     SKIN IRRITATION AFTER PROLONGED USE  . Shellfish Allergy     CRAB LEGS-PT STATES ONLY WITH EATING A LOT OF-PT STILL EATS CRABLEGS    Medications reviewed.    ROS A multipoint review of systems was completed, all pertinent positives and negatives are documented within the history of present illness and remainder are negative.   BP 139/85 mmHg  Pulse 76  Temp(Src) 98.3 F (36.8 C) (Oral)  Wt 76.658 kg (169 lb)  Physical Exam Gen.: No acute distress Chest: Clear to auscultation Heart: Regular rate and  rhythm Abdomen: Soft, nondistended. Tender to palpation along the inguinal canal and into the scrotum along the spermatic cord. No evidence of hernia recurrence or active infection. His laparoscopic incision sites are well approximated without any evidence of erythema or drainage.    No results found for this or any previous visit (from the past 48 hour(s)). No results found.  Assessment/Plan:  1. Aftercare following surgery 40 year old male with continued pain after laparoscopic inguinal hernia repair. Discussed patient pain control techniques and provided him with anti-inflammatory medication prescriptions since he does not want to take any narcotics. Discussed him taking another week off from work and on the clinic early next week should he still be having severe pain. So long as he is improving he can follow up on an as-needed basis otherwise we'll see back in clinic next week. - celecoxib (CELEBREX) 200 MG capsule; Take 1 capsule (200 mg total) by mouth daily.  Dispense: 30 capsule; Refill: 2 - traMADol (ULTRAM) 50 MG tablet; Take 1 tablet (50 mg total) by mouth every 6 (six) hours as needed.  Dispense: 20 tablet; Refill: 0     Clayburn Pert, MD [facility] General Surgeon  11/13/2015,12:08 PM

## 2015-11-13 NOTE — Patient Instructions (Signed)
You will remain off work for 1 more week. Please see the note provided. If you are still having pain next Wednesday and you feel like you need to be seen for more time off, give [place] a call right then so that we can place you on the schedule to be seen by a surgeon.  We have given you a medication today that should help with inflammation and pain. Please make sure that you eat before you take either of these medications.

## 2015-12-24 ENCOUNTER — Telehealth: Payer: Self-pay | Admitting: General Surgery

## 2015-12-24 NOTE — Telephone Encounter (Signed)
Patient has called and has some questions that he would like to discuss concerning his back to work letter. Laparoscopic Inguinal Hernia Repair Dr. Adonis Huguenin 11/04/2015   November 13, 2015   Patient: Devin Silva  Date of Birth: [birth year]  Date of Visit: 11/13/2015  Employer:     To Whom It May Concern:  This is to certify that ARDEAN ELKO was under my professional care from 11/04/15 to 11/23/15, inclusive, and was totally incapacitated during this time.  This is to further certify that Mr. Lutzow has now recovered sufficiently to be able to return to on 11/24/15 with the following restrictions:  no lifting greater than 15 lbs. and no driving while on narcotic pain medications until 12/16/15.  If you have any questions or concerns, please don't hesitate to call.   Sincerely,

## 2015-12-25 NOTE — Telephone Encounter (Signed)
Patient returned phone call. Fax number given is [phone].  Faxed FMLA paperwork to this number with positive confirmation at this time.

## 2015-12-25 NOTE — Telephone Encounter (Signed)
Returned phone call to patient at this time. Patient would like FMLA paperwork faxed to [employer]. He will be call back with his fax number for social services so that this may be faxed.

## 2016-01-15 ENCOUNTER — Encounter: Payer: Self-pay | Admitting: General Surgery

## 2017-02-19 IMAGING — CR DG CHEST 2V
2 series · 2 of 2 positions shown · non-contrast
Comparison: None in PACs

CLINICAL DATA: Preoperative study prior to hernia repair, no
cardiopulmonary history, current smoker.

EXAM:
CHEST  2 VIEW

[chest pa]
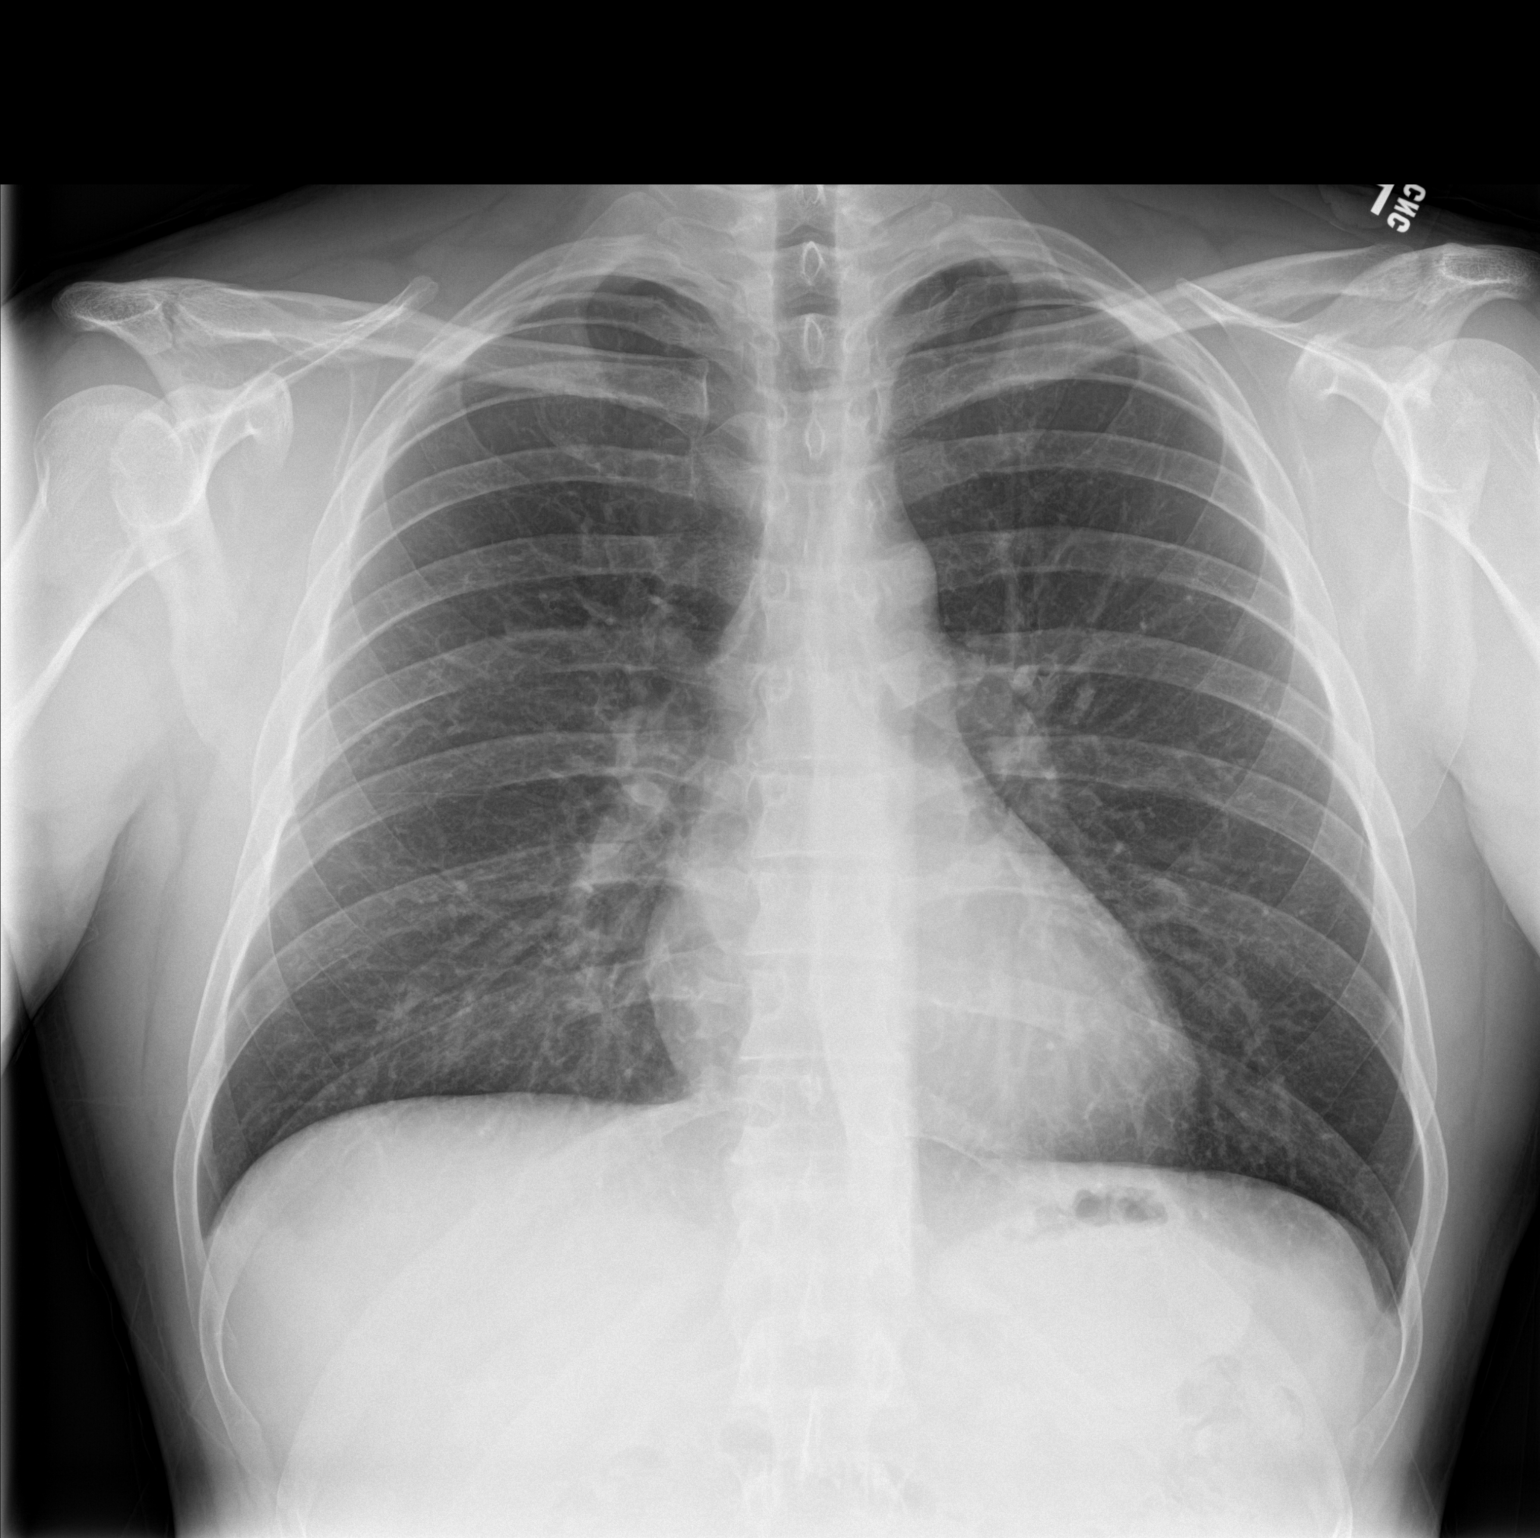

[chest lat]
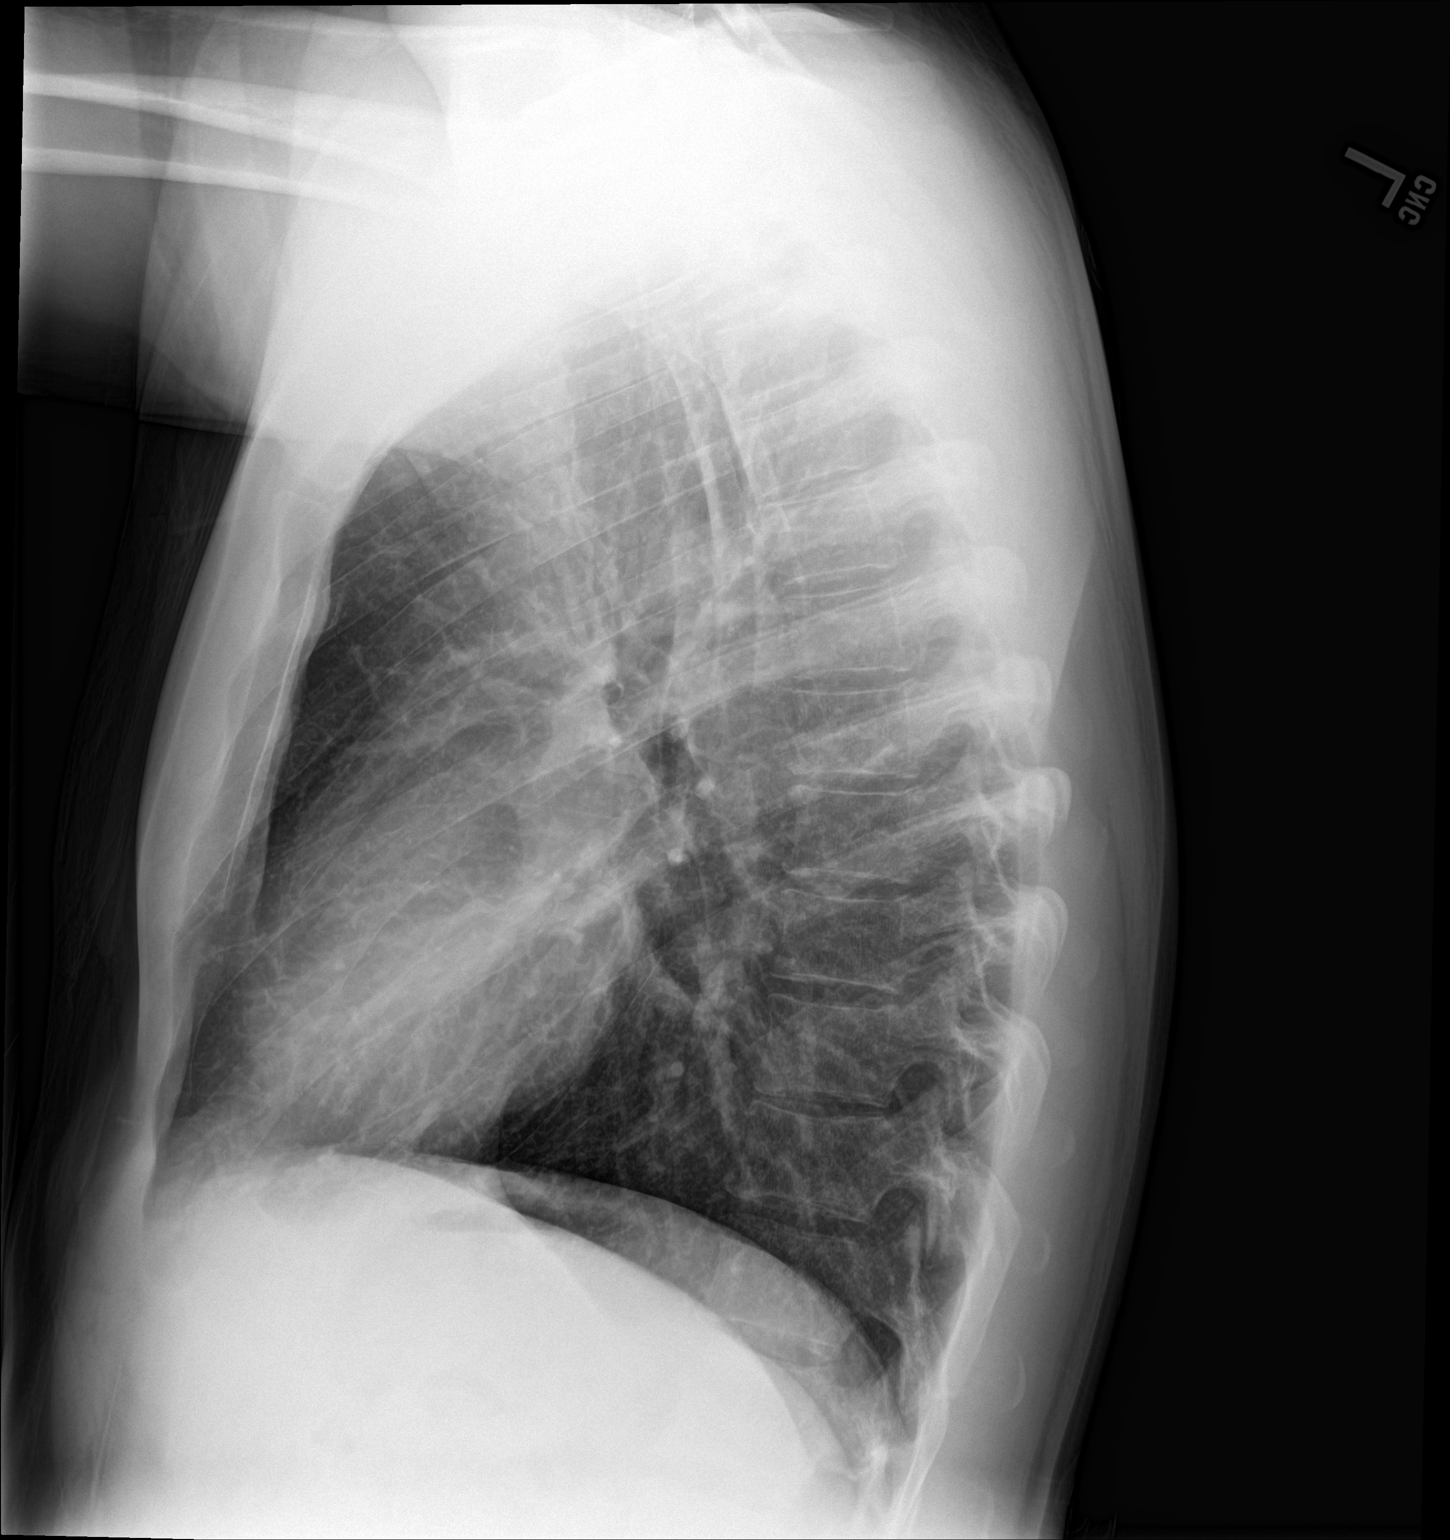

[2 of 2 positions shown; findings below may reference images not displayed]

FINDINGS: The lungs are mildly hyperinflated with mild hemidiaphragm
flattening. There is no focal infiltrate. The interstitial markings
are coarse. The heart and pulmonary vascularity are normal. The
mediastinum is normal in width. The trachea is midline. The bony
thorax is unremarkable.
IMPRESSION: Mild hyperinflation and interstitial prominence likely reflects this
patient's smoking history. There is no evidence of pneumonia, CHF,
nor other acute cardiopulmonary abnormality.
